# Patient Record
Sex: Male | Born: 1970 | Race: White | Hispanic: No | Marital: Married
Health system: Southern US, Community
[De-identification: ages and names within clinical notes are randomized; demographics above are authoritative.]

---

## 2002-09-24 ENCOUNTER — Encounter: Payer: Self-pay | Admitting: Internal Medicine

## 2002-09-24 ENCOUNTER — Encounter: Admission: RE | Admit: 2002-09-24 | Discharge: 2002-09-24 | Payer: Self-pay | Admitting: Internal Medicine

## 2002-11-12 ENCOUNTER — Encounter: Payer: Self-pay | Admitting: Gastroenterology

## 2002-11-12 ENCOUNTER — Ambulatory Visit (HOSPITAL_COMMUNITY): Admission: RE | Admit: 2002-11-12 | Discharge: 2002-11-12 | Payer: Self-pay | Admitting: Gastroenterology

## 2007-08-06 ENCOUNTER — Emergency Department (HOSPITAL_COMMUNITY): Admission: EM | Admit: 2007-08-06 | Discharge: 2007-08-06 | Payer: Self-pay | Admitting: Emergency Medicine

## 2009-04-06 ENCOUNTER — Encounter: Admission: RE | Admit: 2009-04-06 | Discharge: 2009-04-06 | Payer: Self-pay | Admitting: Family Medicine

## 2010-03-21 IMAGING — CT CT PELVIS W/O CM
2 of 4 series · 17 of 46 positions shown, 19 images · non-contrast
Comparison: MRI abdomen report 11/12/2002 and CT abdomen report
09/24/02

CT ABDOMEN

CLINICAL DATA: Hematuria.  Left back pain.

CT ABDOMEN AND PELVIS WITHOUT CONTRAST
TECHNIQUE: Multidetector CT imaging of the abdomen and pelvis was
performed following the standard protocol without intravenous
contrast.

[Series 2: wo · axial · 0.74mm/px · z∈[+485,+945]mm · 14 of 101 slices shown, 16 images]
[im 5/101  soft-tissue]
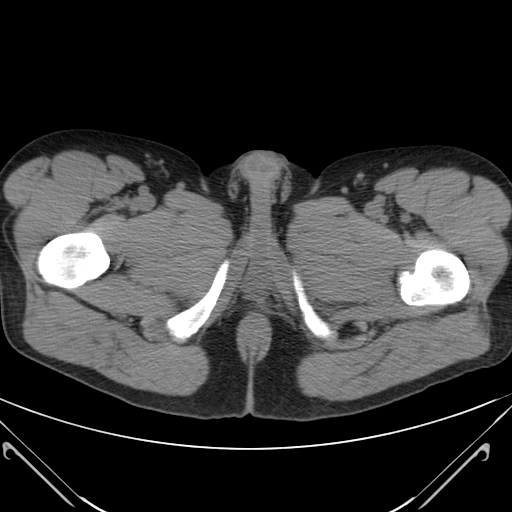
[im 5/101  bone]
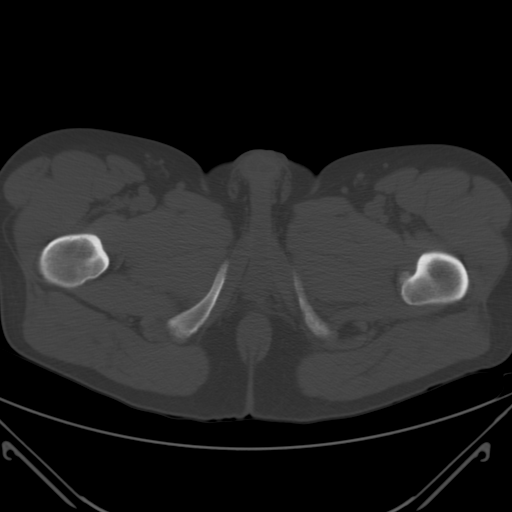
[im 13/101  soft-tissue]
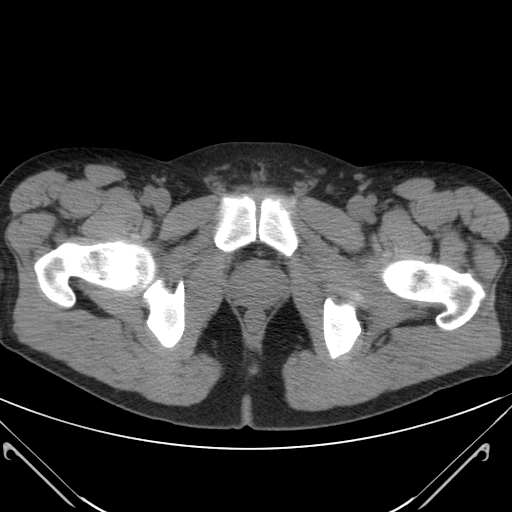
[im 21/101  soft-tissue]
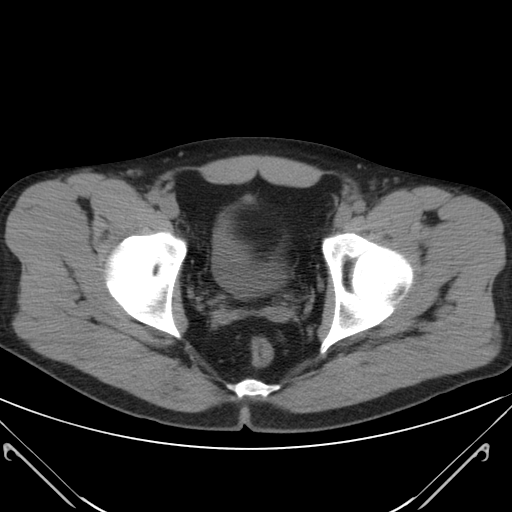
[im 29/101  soft-tissue]
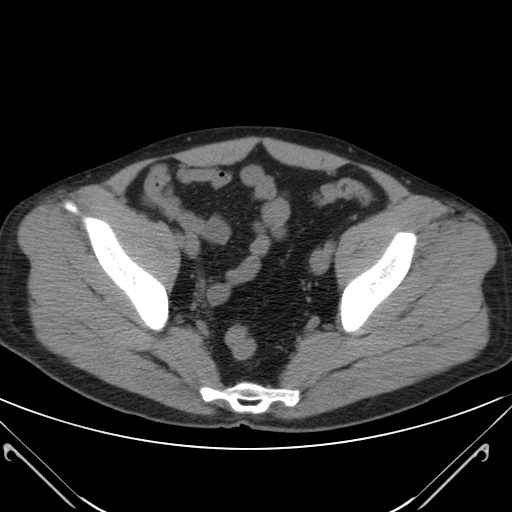
[im 33/101  soft-tissue]
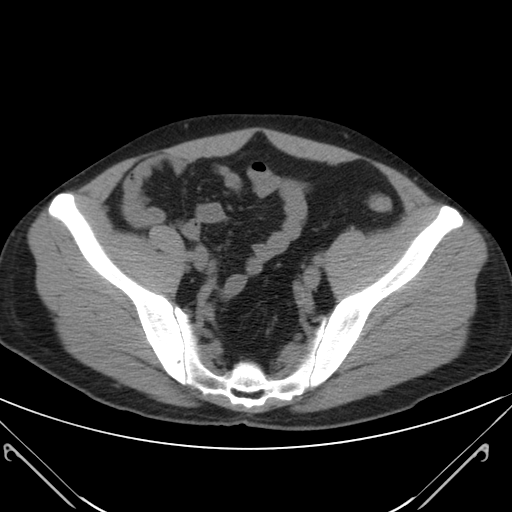
[im 41/101  soft-tissue]
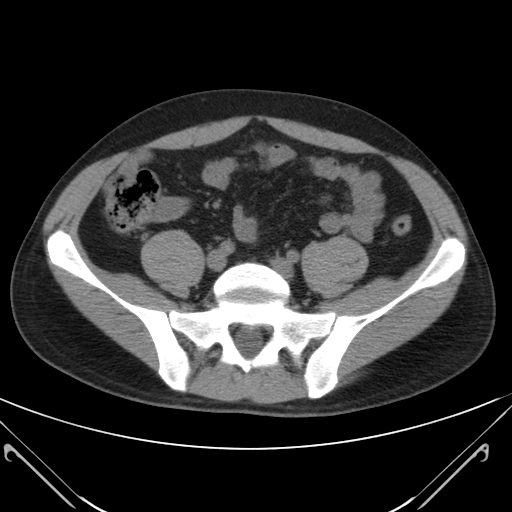
[im 49/101  soft-tissue]
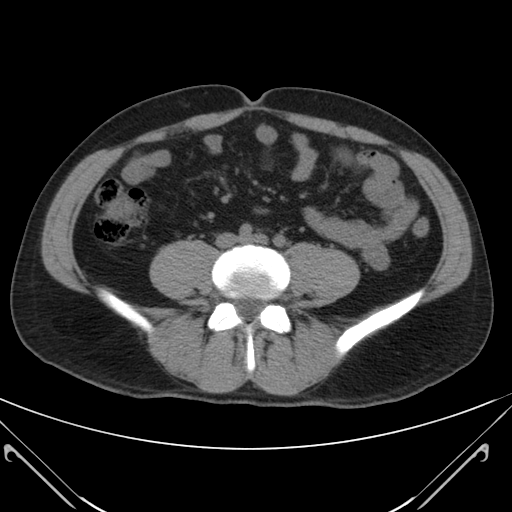
[im 53/101  soft-tissue]
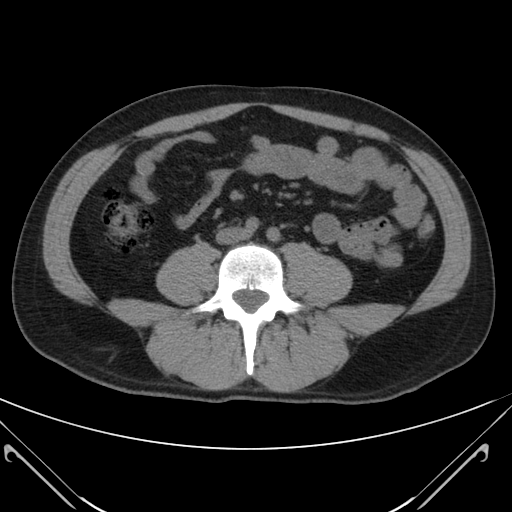
[im 61/101  soft-tissue]
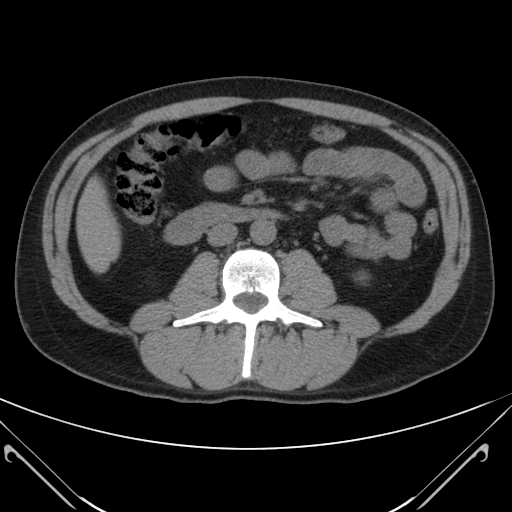
[im 61/101  bone]
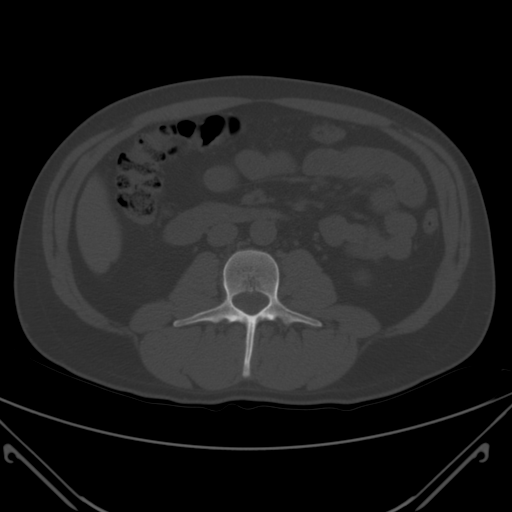
[im 69/101  soft-tissue]
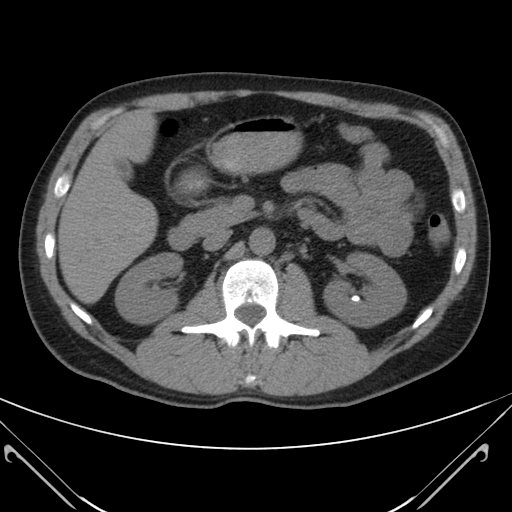
[im 77/101  soft-tissue]
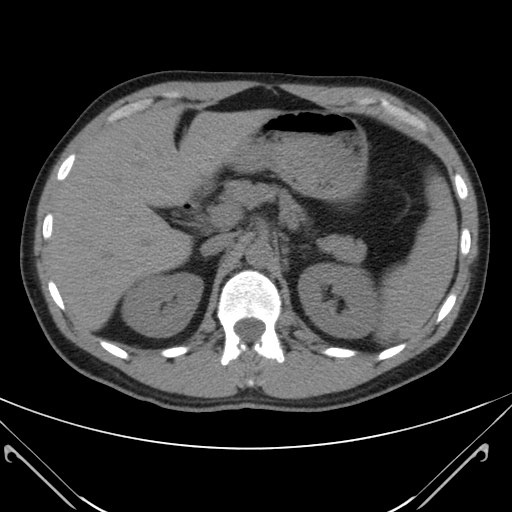
[im 81/101  soft-tissue]
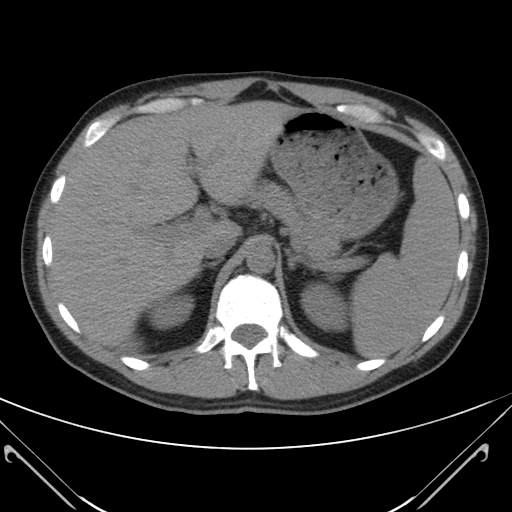
[im 89/101  soft-tissue]
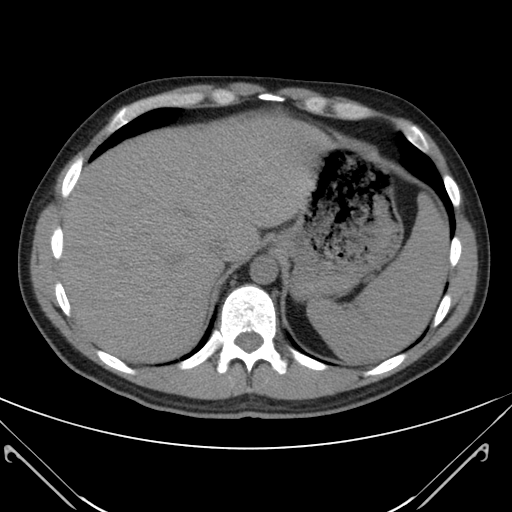
[im 97/101  soft-tissue]
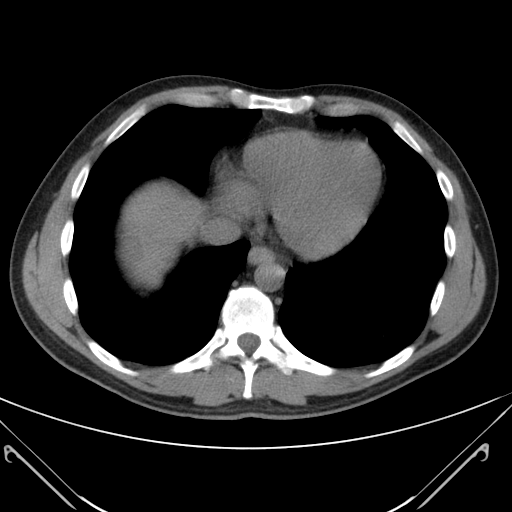

[coronals · coronal · 0.97mm/px · 3 of 116 slices shown]
[im 39/116  soft-tissue]
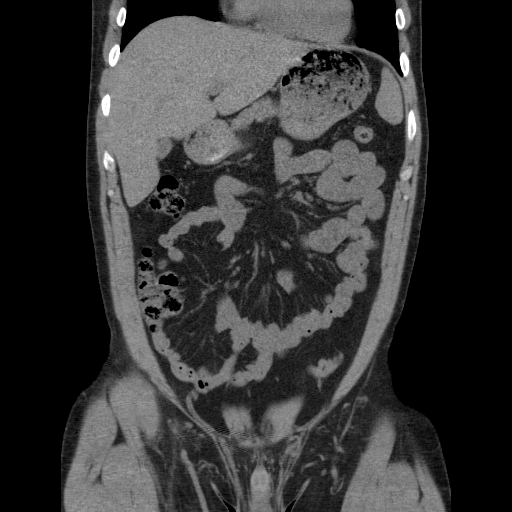
[im 52/116  soft-tissue]
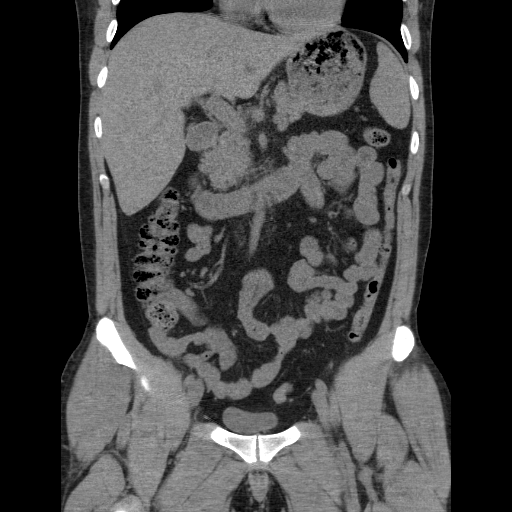
[im 64/116  soft-tissue]
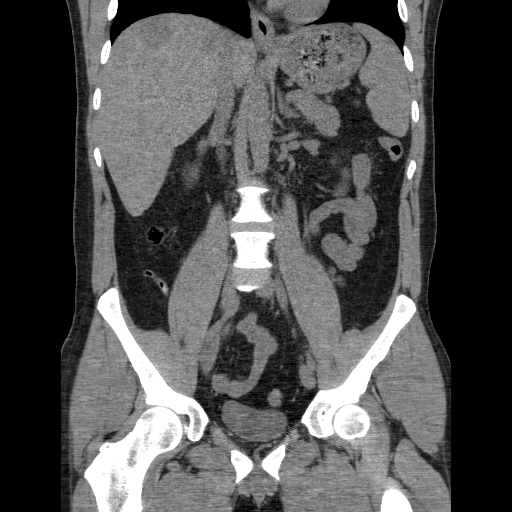

[17 of 46 positions shown; findings below may reference images not displayed]

FINDINGS: 5 x 7 mm left lower pole calculus is present.  There is
no hydronephrosis of either kidney.  No right renal calculi are
identified.  There is no perinephric edema.

2.9 x 2.7 cm low density lesion in the dome of the liver on the
right corresponds to   hemangioma as identified on prior MRI.
There is a second smaller low density lesion anteriorly in the
right lobe of liver measuring 14 mm also identified as a hemangioma
on the prior MRI.  The spleen is normal.  The pancreas is normal.
The bowel is normal.
IMPRESSION: 5 x 7 mm nonobstructing calculus in the left lower pole.  There is
no hydronephrosis.

CT PELVIS
FINDINGS: Two small calcifications are present in the right pelvis
on image number 71.  There is some surrounding mild edema.  These
appear to be within the ureter but are not causing any obstruction.
They were not described on the prior CT report.  The patient
describes left back pain at this time however I remain suspicious
that these may be   nonobstructing ureteral calculi.  Alternately,
this could be a calcified lymph node.  There is no arterial
calcification elsewhere and I do not believe this is vascular in
nature.  Urinary bladder is normal.  There is no free fluid mass or
adenopathy.  There is no bowel obstruction.  The appendix is
normal.
IMPRESSION: Two small calcifications in the right mid pelvis appear to be
within the right ureter however are not causing any hydronephrosis.
Correlation with symptoms is suggested.  Otherwise negative.

## 2019-10-30 ENCOUNTER — Ambulatory Visit: Payer: Self-pay | Attending: Internal Medicine

## 2019-10-30 DIAGNOSIS — Z23 Encounter for immunization: Secondary | ICD-10-CM

## 2019-10-30 NOTE — Progress Notes (Signed)
   [facility] Vaccination [facility]  Name:  Colin Jones    MRN: [id number] DOB: Jan 13, 1971  10/30/2019  [name] was observed post Covid-19 immunization for 15 minutes without incident. He was provided with Vaccine Information Sheet and instruction to access the V-Safe system.   [name] was instructed to call 911 with any severe reactions post vaccine: Marland Kitchen Difficulty breathing  . Swelling of face and throat  . A fast heartbeat  . A bad rash all over body  . Dizziness and weakness   Immunizations Administered    Name Date Dose VIS Date Route   Pfizer COVID-19 Vaccine 10/30/2019  9:33 AM 0.3 mL 07/11/2019 Intramuscular   Manufacturer: [employer], [employer]   Lot: [id number]   NDC: [address]-7

## 2019-11-24 ENCOUNTER — Ambulatory Visit: Payer: Self-pay | Attending: Internal Medicine

## 2019-11-24 DIAGNOSIS — Z23 Encounter for immunization: Secondary | ICD-10-CM

## 2019-11-24 NOTE — Progress Notes (Signed)
   Covid-19 Vaccination Clinic  Name:  Colin Jones    MRN: [id number] DOB: August 18, 1970  11/24/2019  Colin Jones was observed post Covid-19 immunization for 15 minutes without incident. He was provided with Vaccine Information Sheet and instruction to access the V-Safe system.   Colin Jones was instructed to call 911 with any severe reactions post vaccine: Marland Kitchen Difficulty breathing  . Swelling of face and throat  . A fast heartbeat  . A bad rash all over body  . Dizziness and weakness   Immunizations Administered    Name Date Dose VIS Date Route   Pfizer COVID-19 Vaccine 11/24/2019  8:47 AM 0.3 mL 09/24/2018 Intramuscular   Manufacturer: [employer], [employer]   Lot: [id number]   NDC: [address]-0
